# Patient Record
Sex: Male | Born: 1965 | Race: White | Hispanic: No | Marital: Married | Smoking: Never smoker
Health system: Southern US, Community
[De-identification: ages and names within clinical notes are randomized; demographics above are authoritative.]

## PROBLEM LIST (undated history)

## (undated) DIAGNOSIS — T4145XA Adverse effect of unspecified anesthetic, initial encounter: Secondary | ICD-10-CM

## (undated) DIAGNOSIS — K219 Gastro-esophageal reflux disease without esophagitis: Secondary | ICD-10-CM

## (undated) DIAGNOSIS — R112 Nausea with vomiting, unspecified: Secondary | ICD-10-CM

## (undated) DIAGNOSIS — M109 Gout, unspecified: Secondary | ICD-10-CM

## (undated) DIAGNOSIS — Z9889 Other specified postprocedural states: Secondary | ICD-10-CM

## (undated) DIAGNOSIS — E119 Type 2 diabetes mellitus without complications: Secondary | ICD-10-CM

## (undated) DIAGNOSIS — M479 Spondylosis, unspecified: Secondary | ICD-10-CM

## (undated) DIAGNOSIS — E7800 Pure hypercholesterolemia, unspecified: Secondary | ICD-10-CM

## (undated) DIAGNOSIS — I10 Essential (primary) hypertension: Secondary | ICD-10-CM

## (undated) DIAGNOSIS — T8859XA Other complications of anesthesia, initial encounter: Secondary | ICD-10-CM

## (undated) HISTORY — PX: HERNIA REPAIR: SHX51

## (undated) HISTORY — PX: BACK SURGERY: SHX140

---

## 2001-12-07 ENCOUNTER — Emergency Department (HOSPITAL_COMMUNITY): Admission: EM | Admit: 2001-12-07 | Discharge: 2001-12-07 | Payer: Self-pay | Admitting: *Deleted

## 2001-12-07 ENCOUNTER — Encounter: Payer: Self-pay | Admitting: *Deleted

## 2004-06-07 ENCOUNTER — Emergency Department (HOSPITAL_COMMUNITY): Admission: EM | Admit: 2004-06-07 | Discharge: 2004-06-07 | Payer: Self-pay | Admitting: Family Medicine

## 2014-03-15 ENCOUNTER — Emergency Department (HOSPITAL_COMMUNITY): Payer: Managed Care, Other (non HMO)

## 2014-03-15 ENCOUNTER — Emergency Department (HOSPITAL_COMMUNITY)
Admission: EM | Admit: 2014-03-15 | Discharge: 2014-03-15 | Disposition: A | Discharge status: Home / Self Care | Payer: Managed Care, Other (non HMO) | Attending: Emergency Medicine | Admitting: Emergency Medicine

## 2014-03-15 ENCOUNTER — Encounter (HOSPITAL_COMMUNITY): Payer: Self-pay | Admitting: Emergency Medicine

## 2014-03-15 DIAGNOSIS — M1990 Unspecified osteoarthritis, unspecified site: Secondary | ICD-10-CM | POA: Insufficient documentation

## 2014-03-15 DIAGNOSIS — M545 Low back pain, unspecified: Secondary | ICD-10-CM

## 2014-03-15 DIAGNOSIS — Z9889 Other specified postprocedural states: Secondary | ICD-10-CM | POA: Insufficient documentation

## 2014-03-15 DIAGNOSIS — G8929 Other chronic pain: Secondary | ICD-10-CM | POA: Insufficient documentation

## 2014-03-15 MED ORDER — DEXAMETHASONE SODIUM PHOSPHATE 10 MG/ML IJ SOLN
10.0000 mg | Freq: Once | INTRAMUSCULAR | Status: AC
  Administered 2014-03-15: 10 mg via INTRAVENOUS
  Filled 2014-03-15: qty 1

## 2014-03-15 MED ORDER — DIAZEPAM 5 MG PO TABS: 5.0000 mg | ORAL_TABLET | Freq: Two times a day (BID) | ORAL | Status: DC

## 2014-03-15 MED ORDER — PREDNISONE 10 MG PO TABS: 20.0000 mg | ORAL_TABLET | Freq: Every day | ORAL | Status: DC

## 2014-03-15 MED ORDER — OXYCODONE-ACETAMINOPHEN 5-325 MG PO TABS
2.0000 | ORAL_TABLET | Freq: Once | ORAL | Status: AC
  Administered 2014-03-15: 2 via ORAL
  Filled 2014-03-15: qty 2

## 2014-03-15 MED ORDER — KETOROLAC TROMETHAMINE 30 MG/ML IJ SOLN
30.0000 mg | Freq: Once | INTRAMUSCULAR | Status: AC
  Administered 2014-03-15: 30 mg via INTRAVENOUS
  Filled 2014-03-15: qty 1

## 2014-03-15 MED ORDER — OXYCODONE-ACETAMINOPHEN 5-325 MG PO TABS: 1.0000 | ORAL_TABLET | Freq: Four times a day (QID) | ORAL | Status: DC | PRN

## 2014-03-15 MED ORDER — HYDROMORPHONE HCL PF 1 MG/ML IJ SOLN
1.0000 mg | Freq: Once | INTRAMUSCULAR | Status: AC
  Administered 2014-03-15: 1 mg via INTRAVENOUS
  Filled 2014-03-15: qty 1

## 2014-03-15 MED ORDER — ONDANSETRON HCL 4 MG/2ML IJ SOLN
4.0000 mg | Freq: Once | INTRAMUSCULAR | Status: AC
  Administered 2014-03-15: 4 mg via INTRAVENOUS
  Filled 2014-03-15: qty 2

## 2014-03-15 NOTE — ED Provider Notes (Signed)
CSN: [id number]     Arrival date & time 03/15/14  1146 History  This chart was scribed for non-physician practitioner Dorthula Matasiffany G Jyl Chico, PA-C working with Glynn OctaveStephen Rancour, MD by Leone PayorSonum Patel, ED Scribe. This patient was seen in room TR06C/TR06C and the patient's care was started at 1:16 PM.     Chief Complaint  Patient presents with  . Back Pain      The history is provided by the patient. No language interpreter was used.    HPI Comments: Maurice Barker is a 48 y.o. male who presents to the Emergency Department complaining of chronic, constant left lower back pain that radiates down the left leg which worsened 3 days ago. He rates his pain 5/10 currently but states it increases when bearing weight on the left leg. Pt states he was at work which required him to twist his back and lean over all day for 3 days in a row wo lifting heavy weight. He has a history of laminectomy in the L3, L4 region. Pt states he cannot walk very well due to the pain. He denies numbness.  Denies weakness in legs or bowel/urine incontinence.    History reviewed. No pertinent past medical history. Past Surgical History  Procedure Laterality Date  . Back surgery     History reviewed. No pertinent family history. History  Substance Use Topics  . Smoking status: Never Smoker   . Smokeless tobacco: Not on file  . Alcohol Use: Yes    Review of Systems  Musculoskeletal: Positive for back pain. Negative for neck pain.  All other systems reviewed and are negative.      Allergies  Review of patient's allergies indicates no known allergies.  Home Medications   Current Outpatient Rx  Name  Route  Sig  Dispense  Refill  . diazepam (VALIUM) 5 MG tablet   Oral   Take 1 tablet (5 mg total) by mouth 2 (two) times daily.   10 tablet   0   . oxyCODONE-acetaminophen (PERCOCET/ROXICET) 5-325 MG per tablet   Oral   Take 1-2 tablets by mouth every 6 (six) hours as needed for severe pain.   20 tablet   0   .  predniSONE (DELTASONE) 10 MG tablet   Oral   Take 2 tablets (20 mg total) by mouth daily.   21 tablet   0     Prednisone dose pack directions:   6 tabs on day ...    BP 189/113  Pulse 108  Temp(Src) 97.3 F (36.3 C) (Oral)  Resp 20  Ht 6\' 2"  (1.88 m)  Wt 267 lb (121.11 kg)  BMI 34.27 kg/m2  SpO2 98% Physical Exam  Nursing note and vitals reviewed. Constitutional: He is oriented to person, place, and time. He appears well-developed and well-nourished.  HENT:  Head: Normocephalic and atraumatic.  Cardiovascular: Normal rate.   Pulmonary/Chest: Effort normal.  Abdominal: He exhibits no distension.  Musculoskeletal: He exhibits tenderness.  Left sided lumbar paraspinal tenderness that radiates down towards the left knee. Normal pedal pulse. Unable to access strength due to pain. NVI.   Neurological: He is alert and oriented to person, place, and time.  Skin: Skin is warm and dry.  Psychiatric: He has a normal mood and affect.    ED Course  Procedures (including critical care time)  DIAGNOSTIC STUDIES: Oxygen Saturation is 98% on RA, normal by my interpretation.    COORDINATION OF CARE: 1:19 PM Discussed negative imaging results. IV started to control  pain since patient is unable to ambulate. He has been give 1 mg Dilaudid, 10 mg IV decadron, 30 mg IV Toradol. Lumbar XRAY shows no disc herniation or acute changes. Patient has no focal neuro deficits requiring further imaging at this time. Will attempt to control pain and then reevaluate.    Labs Review Labs Reviewed - No data to display Imaging Review Dg Lumbar Spine Complete  03/15/2014   CLINICAL DATA:  Low back pain with left-sided radicular symptoms  EXAM: LUMBAR SPINE - COMPLETE 4+ VIEW  COMPARISON:  None.  FINDINGS: Frontal, lateral, spot lumbosacral lateral, and bilateral oblique views were obtained. There are 5 non-rib-bearing lumbar type vertebral bodies. There is anterior wedging of the T12 and L1 vertebral bodies  which does not appear acute. No other fractures. No spondylolisthesis. Is mild disc space narrowing at L1-2 and L4-5. There is facet osteoarthritic change at L5-S1 bilaterally.  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 Areas of osteoarthritic change. No spondylolisthesis.   Electronically Signed   By: Bretta Bang M.D.   On: 03/15/2014 12:56     EKG Interpretation None      MDM   Final diagnoses:  Low back pain  Osteoarthritis    Patients pain improved greatly with medications Has an appointment for the [facility]  48 y.o.Maurice Barker's  with back pain. No neurological deficits and normal neuro exam. Patient can walk but states is painful. No loss of bowel or bladder control. No concern for cauda equina. No fever, night sweats, weight loss, h/o cancer, IVDU. RICE protocol and pain medicine indicated and discussed with patient.   Patient Plan 1. Medications: narcotic pain medication, muscle relaxer and usual home medications  2. Treatment: rest, drink plenty of fluids, gentle stretching as discussed, alternate ice and heat  3. Follow Up: Please followup with your primary doctor for discussion of your diagnoses and further evaluation after today's visit; if you do not have a primary care doctor use the resource guide provided to find one   Vital signs are stable at discharge. Filed Vitals:   03/15/14 1207  BP: 189/113  Pulse: 108  Temp: 97.3 F (36.3 C)  Resp: 20    Patient/guardian has voiced understanding and agreed to follow-up with the PCP or specialist.     I personally performed the services described in this documentation, which was scribed in my presence. The recorded information has been reviewed and is accurate.      Dorthula Matas, PA-C 03/15/14 1447

## 2014-03-15 NOTE — ED Provider Notes (Signed)
Medical screening examination/treatment/procedure(s) were performed by non-physician practitioner and as supervising physician I was immediately available for consultation/collaboration.   EKG Interpretation None        Glynn OctaveStephen Lorriane Dehart, MD 03/15/14 [phone]

## 2014-03-15 NOTE — Discharge Instructions (Signed)
Back Pain, Adult Low back pain is very common. About 1 in 5 people have back pain.The cause of low back pain is rarely dangerous. The pain often gets better over time.About half of people with a sudden onset of back pain feel better in just 2 weeks. About 8 in 10 people feel better by 6 weeks.  CAUSES Some common causes of back pain include:  Strain of the muscles or ligaments supporting the spine.  Wear and tear (degeneration) of the spinal discs.  Arthritis.  Direct injury to the back. DIAGNOSIS Most of the time, the direct cause of low back pain is not known.However, back pain can be treated effectively even when the exact cause of the pain is unknown.Answering your caregiver's questions about your overall health and symptoms is one of the most accurate ways to make sure the cause of your pain is not dangerous. If your caregiver needs more information, he or she may order lab work or imaging tests (X-rays or MRIs).However, even if imaging tests show changes in your back, this usually does not require surgery. HOME CARE INSTRUCTIONS For many people, back pain returns.Since low back pain is rarely dangerous, it is often a condition that people can learn to manageon their own.   Remain active. It is stressful on the back to sit or stand in one place. Do not sit, drive, or stand in one place for more than 30 minutes at a time. Take short walks on level surfaces as soon as pain allows.Try to increase the length of time you walk each day.  Do not stay in bed.Resting more than 1 or 2 days can delay your recovery.  Do not avoid exercise or work.Your body is made to move.It is not dangerous to be active, even though your back may hurt.Your back will likely heal faster if you return to being active before your pain is gone.  Pay attention to your body when you bend and lift. Many people have less discomfortwhen lifting if they bend their knees, keep the load close to their bodies,and  avoid twisting. Often, the most comfortable positions are those that put less stress on your recovering back.  Find a comfortable position to sleep. Use a firm mattress and lie on your side with your knees slightly bent. If you lie on your back, put a pillow under your knees.  Only take over-the-counter or prescription medicines as directed by your caregiver. Over-the-counter medicines to reduce pain and inflammation are often the most helpful.Your caregiver may prescribe muscle relaxant drugs.These medicines help dull your pain so you can more quickly return to your normal activities and healthy exercise.  Put ice on the injured area.  Put ice in a plastic bag.  Place a towel between your skin and the bag.  Leave the ice on for 15-20 minutes, 03-04 times a day for the first 2 to 3 days. After that, ice and heat may be alternated to reduce pain and spasms.  Ask your caregiver about trying back exercises and gentle massage. This may be of some benefit.  Avoid feeling anxious or stressed.Stress increases muscle tension and can worsen back pain.It is important to recognize when you are anxious or stressed and learn ways to manage it.Exercise is a great option. SEEK MEDICAL CARE IF:  You have pain that is not relieved with rest or medicine.  You have pain that does not improve in 1 week.  You have new symptoms.  You are generally not feeling well. SEEK   IMMEDIATE MEDICAL CARE IF:   You have pain that radiates from your back into your legs.  You develop new bowel or bladder control problems.  You have unusual weakness or numbness in your arms or legs.  You develop nausea or vomiting.  You develop abdominal pain.  You feel faint. Document Released: 12/13/2005 Document Revised: 06/13/2012 Document Reviewed: 05/03/2011 [employer] Patient Information 2014 [employer].  

## 2014-03-15 NOTE — ED Notes (Signed)
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

## 2014-03-20 ENCOUNTER — Encounter (HOSPITAL_COMMUNITY): Payer: Self-pay | Admitting: *Deleted

## 2014-03-20 ENCOUNTER — Other Ambulatory Visit: Payer: Self-pay | Admitting: Neurosurgery

## 2014-03-20 NOTE — Progress Notes (Signed)
According to pt, he was informed by Arlys JohnBrian, RN at surgeons office that he can eat up until 8:00AM on the DOS and was also instructed by the MD that he did not have to take the last dose of Prednisone if he didn't want to. Pt stated that he had a BMP lab draw done today 03/20/14 and denies having an EKG, chest x ray within the last 12 months and any cardiac studies.

## 2014-03-21 ENCOUNTER — Ambulatory Visit (HOSPITAL_COMMUNITY): Payer: Managed Care, Other (non HMO) | Admitting: Certified Registered Nurse Anesthetist

## 2014-03-21 ENCOUNTER — Ambulatory Visit (HOSPITAL_COMMUNITY): Payer: Managed Care, Other (non HMO)

## 2014-03-21 ENCOUNTER — Encounter (HOSPITAL_COMMUNITY)
Admission: RE | Disposition: A | Discharge status: Home / Self Care | Payer: Self-pay | Source: Ambulatory Visit | Attending: Neurosurgery

## 2014-03-21 ENCOUNTER — Encounter (HOSPITAL_COMMUNITY): Payer: Self-pay | Admitting: *Deleted

## 2014-03-21 ENCOUNTER — Encounter (HOSPITAL_COMMUNITY): Payer: Managed Care, Other (non HMO) | Admitting: Certified Registered Nurse Anesthetist

## 2014-03-21 ENCOUNTER — Observation Stay (HOSPITAL_COMMUNITY)
Admission: RE | Admit: 2014-03-21 | Discharge: 2014-03-22 | Disposition: A | Discharge status: Home / Self Care | Payer: Managed Care, Other (non HMO) | Source: Ambulatory Visit | Attending: Neurosurgery | Admitting: Neurosurgery

## 2014-03-21 DIAGNOSIS — M5126 Other intervertebral disc displacement, lumbar region: Principal | ICD-10-CM | POA: Insufficient documentation

## 2014-03-21 DIAGNOSIS — I10 Essential (primary) hypertension: Secondary | ICD-10-CM | POA: Insufficient documentation

## 2014-03-21 DIAGNOSIS — I509 Heart failure, unspecified: Secondary | ICD-10-CM | POA: Insufficient documentation

## 2014-03-21 DIAGNOSIS — Z7982 Long term (current) use of aspirin: Secondary | ICD-10-CM | POA: Insufficient documentation

## 2014-03-21 DIAGNOSIS — G4730 Sleep apnea, unspecified: Secondary | ICD-10-CM | POA: Insufficient documentation

## 2014-03-21 DIAGNOSIS — M47817 Spondylosis without myelopathy or radiculopathy, lumbosacral region: Secondary | ICD-10-CM | POA: Insufficient documentation

## 2014-03-21 DIAGNOSIS — M51379 Other intervertebral disc degeneration, lumbosacral region without mention of lumbar back pain or lower extremity pain: Secondary | ICD-10-CM | POA: Insufficient documentation

## 2014-03-21 DIAGNOSIS — M5137 Other intervertebral disc degeneration, lumbosacral region: Secondary | ICD-10-CM | POA: Insufficient documentation

## 2014-03-21 DIAGNOSIS — I252 Old myocardial infarction: Secondary | ICD-10-CM | POA: Insufficient documentation

## 2014-03-21 DIAGNOSIS — E119 Type 2 diabetes mellitus without complications: Secondary | ICD-10-CM | POA: Insufficient documentation

## 2014-03-21 HISTORY — DX: Pure hypercholesterolemia, unspecified: E78.00

## 2014-03-21 HISTORY — DX: Gastro-esophageal reflux disease without esophagitis: K21.9

## 2014-03-21 HISTORY — DX: Other complications of anesthesia, initial encounter: T88.59XA

## 2014-03-21 HISTORY — DX: Other specified postprocedural states: Z98.890

## 2014-03-21 HISTORY — PX: LUMBAR LAMINECTOMY/DECOMPRESSION MICRODISCECTOMY: SHX5026

## 2014-03-21 HISTORY — DX: Gout, unspecified: M10.9

## 2014-03-21 HISTORY — DX: Type 2 diabetes mellitus without complications: E11.9

## 2014-03-21 HISTORY — DX: Essential (primary) hypertension: I10

## 2014-03-21 HISTORY — DX: Nausea with vomiting, unspecified: R11.2

## 2014-03-21 HISTORY — DX: Spondylosis, unspecified: M47.9

## 2014-03-21 HISTORY — DX: Adverse effect of unspecified anesthetic, initial encounter: T41.45XA

## 2014-03-21 LAB — COMPREHENSIVE METABOLIC PANEL
ALT: 37 U/L (ref 0–53)
AST: 23 U/L (ref 0–37)
Albumin: 3.8 g/dL (ref 3.5–5.2)
Alkaline Phosphatase: 61 U/L (ref 39–117)
BUN: 19 mg/dL (ref 6–23)
CHLORIDE: 94 meq/L — AB (ref 96–112)
CO2: 28 meq/L (ref 19–32)
Calcium: 9.5 mg/dL (ref 8.4–10.5)
Creatinine, Ser: 0.86 mg/dL (ref 0.50–1.35)
GFR calc Af Amer: >90 mL/min (ref >90)
GFR calc non Af Amer: >90 mL/min (ref >90)
Glucose, Bld: 216 mg/dL — ABNORMAL HIGH (ref 70–99)
Potassium: 4.7 mEq/L (ref 3.7–5.3)
SODIUM: 137 meq/L (ref 137–147)
Total Bilirubin: 0.6 mg/dL (ref 0.3–1.2)
Total Protein: 7.3 g/dL (ref 6.0–8.3)

## 2014-03-21 LAB — CBC
HCT: 47.7 % (ref 39.0–52.0)
Hemoglobin: 16.6 g/dL (ref 13.0–17.0)
MCH: 28.4 pg (ref 26.0–34.0)
MCHC: 34.8 g/dL (ref 30.0–36.0)
MCV: 81.5 fL (ref 78.0–100.0)
PLATELETS: 309 10*3/uL (ref 150–400)
RBC: 5.85 MIL/uL — ABNORMAL HIGH (ref 4.22–5.81)
RDW: 13.2 % (ref 11.5–15.5)
WBC: 11.2 10*3/uL — ABNORMAL HIGH (ref 4.0–10.5)

## 2014-03-21 LAB — GLUCOSE, CAPILLARY
GLUCOSE-CAPILLARY: 153 mg/dL — AB (ref 70–99)
Glucose-Capillary: 217 mg/dL — ABNORMAL HIGH (ref 70–99)
Glucose-Capillary: 188 mg/dL — ABNORMAL HIGH (ref 70–99)

## 2014-03-21 LAB — SURGICAL PCR SCREEN
MRSA, PCR: NEGATIVE
Staphylococcus aureus: NEGATIVE

## 2014-03-21 SURGERY — LUMBAR LAMINECTOMY/DECOMPRESSION MICRODISCECTOMY 1 LEVEL
Anesthesia: General | Site: Back | Laterality: Left

## 2014-03-21 MED ORDER — ROCURONIUM BROMIDE 50 MG/5ML IV SOLN
INTRAVENOUS | Status: AC
  Filled 2014-03-21: qty 1

## 2014-03-21 MED ORDER — DIAZEPAM 5 MG PO TABS: 5.0000 mg | ORAL_TABLET | Freq: Two times a day (BID) | ORAL | Status: DC

## 2014-03-21 MED ORDER — OXYCODONE-ACETAMINOPHEN 5-325 MG PO TABS: 1.0000 | ORAL_TABLET | Freq: Four times a day (QID) | ORAL | Status: DC | PRN

## 2014-03-21 MED ORDER — DOCUSATE SODIUM 100 MG PO CAPS
100.0000 mg | ORAL_CAPSULE | Freq: Two times a day (BID) | ORAL | Status: DC
  Administered 2014-03-21: 100 mg via ORAL
  Filled 2014-03-21: qty 1

## 2014-03-21 MED ORDER — SODIUM CHLORIDE 0.9 % IJ SOLN: 3.0000 mL | INTRAMUSCULAR | Status: DC | PRN

## 2014-03-21 MED ORDER — FENTANYL CITRATE 0.05 MG/ML IJ SOLN
INTRAMUSCULAR | Status: DC | PRN
  Administered 2014-03-21: 100 ug via INTRAVENOUS

## 2014-03-21 MED ORDER — FENTANYL CITRATE 0.05 MG/ML IJ SOLN
INTRAMUSCULAR | Status: AC
  Filled 2014-03-21: qty 2

## 2014-03-21 MED ORDER — LINAGLIPTIN 5 MG PO TABS
5.0000 mg | ORAL_TABLET | Freq: Every day | ORAL | Status: DC
  Filled 2014-03-21: qty 1

## 2014-03-21 MED ORDER — MUPIROCIN 2 % EX OINT
TOPICAL_OINTMENT | Freq: Two times a day (BID) | CUTANEOUS | Status: DC
  Administered 2014-03-21: 1 via NASAL
  Filled 2014-03-21 (×2): qty 22

## 2014-03-21 MED ORDER — KETAMINE HCL 100 MG/ML IJ SOLN
INTRAMUSCULAR | Status: AC
  Filled 2014-03-21: qty 1

## 2014-03-21 MED ORDER — ONDANSETRON HCL 4 MG/2ML IJ SOLN
INTRAMUSCULAR | Status: AC
  Filled 2014-03-21: qty 2

## 2014-03-21 MED ORDER — METFORMIN HCL 500 MG PO TABS
500.0000 mg | ORAL_TABLET | Freq: Two times a day (BID) | ORAL | Status: DC
  Administered 2014-03-22: 500 mg via ORAL
  Filled 2014-03-21 (×3): qty 1

## 2014-03-21 MED ORDER — OXYCODONE-ACETAMINOPHEN 5-325 MG PO TABS: 1.0000 | ORAL_TABLET | ORAL | Status: DC | PRN

## 2014-03-21 MED ORDER — ROCURONIUM BROMIDE 100 MG/10ML IV SOLN
INTRAVENOUS | Status: DC | PRN
  Administered 2014-03-21: 30 mg via INTRAVENOUS
  Administered 2014-03-21: 50 mg via INTRAVENOUS

## 2014-03-21 MED ORDER — GLYCOPYRROLATE 0.2 MG/ML IJ SOLN
INTRAMUSCULAR | Status: AC
  Filled 2014-03-21: qty 3

## 2014-03-21 MED ORDER — ATORVASTATIN CALCIUM 10 MG PO TABS
10.0000 mg | ORAL_TABLET | Freq: Every day | ORAL | Status: DC
  Filled 2014-03-21: qty 1

## 2014-03-21 MED ORDER — DIAZEPAM 5 MG PO TABS
5.0000 mg | ORAL_TABLET | Freq: Four times a day (QID) | ORAL | Status: DC | PRN
  Administered 2014-03-21: 5 mg via ORAL

## 2014-03-21 MED ORDER — LIDOCAINE HCL (CARDIAC) 20 MG/ML IV SOLN
INTRAVENOUS | Status: DC | PRN
  Administered 2014-03-21: 80 mg via INTRAVENOUS

## 2014-03-21 MED ORDER — NEOSTIGMINE METHYLSULFATE 1 MG/ML IJ SOLN
INTRAMUSCULAR | Status: DC | PRN
  Administered 2014-03-21: 4 mg via INTRAVENOUS

## 2014-03-21 MED ORDER — KETAMINE HCL 10 MG/ML IJ SOLN
INTRAMUSCULAR | Status: DC | PRN
  Administered 2014-03-21: 40 mg via INTRAVENOUS

## 2014-03-21 MED ORDER — FLEET ENEMA 7-19 GM/118ML RE ENEM: 1.0000 | ENEMA | Freq: Once | RECTAL | Status: AC | PRN

## 2014-03-21 MED ORDER — HEMOSTATIC AGENTS (NO CHARGE) OPTIME
TOPICAL | Status: DC | PRN
  Administered 2014-03-21: 1 via TOPICAL

## 2014-03-21 MED ORDER — ONDANSETRON HCL 4 MG/2ML IJ SOLN: 4.0000 mg | Freq: Once | INTRAMUSCULAR | Status: DC | PRN

## 2014-03-21 MED ORDER — PANTOPRAZOLE SODIUM 40 MG IV SOLR
40.0000 mg | Freq: Every day | INTRAVENOUS | Status: DC
  Administered 2014-03-21: 40 mg via INTRAVENOUS
  Filled 2014-03-21 (×2): qty 40

## 2014-03-21 MED ORDER — NEOSTIGMINE METHYLSULFATE 1 MG/ML IJ SOLN
INTRAMUSCULAR | Status: AC
  Filled 2014-03-21: qty 10

## 2014-03-21 MED ORDER — OXYCODONE HCL 5 MG/5ML PO SOLN: 5.0000 mg | Freq: Once | ORAL | Status: AC | PRN

## 2014-03-21 MED ORDER — KCL IN DEXTROSE-NACL 20-5-0.45 MEQ/L-%-% IV SOLN
INTRAVENOUS | Status: AC
  Filled 2014-03-21: qty 1000

## 2014-03-21 MED ORDER — MIDAZOLAM HCL 5 MG/5ML IJ SOLN
INTRAMUSCULAR | Status: DC | PRN
  Administered 2014-03-21: 2 mg via INTRAVENOUS

## 2014-03-21 MED ORDER — OXYCODONE HCL 5 MG PO TABS
ORAL_TABLET | ORAL | Status: AC
  Filled 2014-03-21: qty 1

## 2014-03-21 MED ORDER — HYDROMORPHONE HCL PF 1 MG/ML IJ SOLN
INTRAMUSCULAR | Status: AC
  Filled 2014-03-21: qty 1

## 2014-03-21 MED ORDER — HYDROCODONE-ACETAMINOPHEN 5-325 MG PO TABS: 1.0000 | ORAL_TABLET | ORAL | Status: DC | PRN

## 2014-03-21 MED ORDER — FENTANYL CITRATE 0.05 MG/ML IJ SOLN
INTRAMUSCULAR | Status: AC
  Filled 2014-03-21: qty 5

## 2014-03-21 MED ORDER — BUPIVACAINE HCL (PF) 0.5 % IJ SOLN
INTRAMUSCULAR | Status: DC | PRN
  Administered 2014-03-21: 10 mL

## 2014-03-21 MED ORDER — INSULIN ASPART 100 UNIT/ML ~~LOC~~ SOLN: 0.0000 [IU] | Freq: Every day | SUBCUTANEOUS | Status: DC

## 2014-03-21 MED ORDER — MENTHOL 3 MG MT LOZG: 1.0000 | LOZENGE | OROMUCOSAL | Status: DC | PRN

## 2014-03-21 MED ORDER — CEFAZOLIN SODIUM 1-5 GM-% IV SOLN
INTRAVENOUS | Status: AC
Start: 2014-03-21 — End: 2014-03-22
  Filled 2014-03-21: qty 50

## 2014-03-21 MED ORDER — DIAZEPAM 5 MG PO TABS
ORAL_TABLET | ORAL | Status: AC
  Filled 2014-03-21: qty 1

## 2014-03-21 MED ORDER — ASPIRIN 81 MG PO CHEW
81.0000 mg | CHEWABLE_TABLET | Freq: Every day | ORAL | Status: DC
  Filled 2014-03-21: qty 1

## 2014-03-21 MED ORDER — SODIUM CHLORIDE 0.9 % IV SOLN
250.0000 mL | INTRAVENOUS | Status: DC
  Administered 2014-03-21: 250 mL via INTRAVENOUS

## 2014-03-21 MED ORDER — CEFAZOLIN SODIUM-DEXTROSE 2-3 GM-% IV SOLR
2.0000 g | Freq: Three times a day (TID) | INTRAVENOUS | Status: AC
  Administered 2014-03-21 – 2014-03-22 (×2): 2 g via INTRAVENOUS
  Filled 2014-03-21 (×2): qty 50

## 2014-03-21 MED ORDER — THROMBIN 5000 UNITS EX SOLR
CUTANEOUS | Status: DC | PRN
  Administered 2014-03-21 (×2): 5000 [IU] via TOPICAL

## 2014-03-21 MED ORDER — SODIUM CHLORIDE 0.9 % IV SOLN
10.0000 mg | INTRAVENOUS | Status: DC | PRN
  Administered 2014-03-21: 20 ug/min via INTRAVENOUS

## 2014-03-21 MED ORDER — INSULIN ASPART 100 UNIT/ML ~~LOC~~ SOLN
3.0000 [IU] | Freq: Three times a day (TID) | SUBCUTANEOUS | Status: DC
  Administered 2014-03-22: 3 [IU] via SUBCUTANEOUS

## 2014-03-21 MED ORDER — SODIUM CHLORIDE 0.9 % IJ SOLN
3.0000 mL | Freq: Two times a day (BID) | INTRAMUSCULAR | Status: DC
  Administered 2014-03-21: 3 mL via INTRAVENOUS

## 2014-03-21 MED ORDER — LIDOCAINE HCL (CARDIAC) 20 MG/ML IV SOLN
INTRAVENOUS | Status: AC
  Filled 2014-03-21: qty 5

## 2014-03-21 MED ORDER — HYDROMORPHONE HCL PF 1 MG/ML IJ SOLN
INTRAMUSCULAR | Status: AC
  Administered 2014-03-21: .2 mg via INTRAVENOUS
  Administered 2014-03-21: .4 mg via INTRAVENOUS
  Administered 2014-03-21 (×2): .2 mg via INTRAVENOUS
  Filled 2014-03-21: qty 1

## 2014-03-21 MED ORDER — SENNA 8.6 MG PO TABS
1.0000 | ORAL_TABLET | Freq: Two times a day (BID) | ORAL | Status: DC
  Administered 2014-03-21: 8.6 mg via ORAL
  Filled 2014-03-21 (×3): qty 1

## 2014-03-21 MED ORDER — CEFAZOLIN SODIUM-DEXTROSE 2-3 GM-% IV SOLR
INTRAVENOUS | Status: AC
  Filled 2014-03-21: qty 50

## 2014-03-21 MED ORDER — MORPHINE SULFATE 2 MG/ML IJ SOLN: 1.0000 mg | INTRAMUSCULAR | Status: DC | PRN

## 2014-03-21 MED ORDER — KCL IN DEXTROSE-NACL 20-5-0.45 MEQ/L-%-% IV SOLN
INTRAVENOUS | Status: DC
  Administered 2014-03-21: 20:00:00 via INTRAVENOUS
  Filled 2014-03-21 (×3): qty 1000

## 2014-03-21 MED ORDER — METHYLPREDNISOLONE ACETATE 80 MG/ML IJ SUSP
INTRAMUSCULAR | Status: DC | PRN
  Administered 2014-03-21: 80 mg

## 2014-03-21 MED ORDER — PREDNISONE 10 MG PO TABS
10.0000 mg | ORAL_TABLET | Freq: Every day | ORAL | Status: DC
  Filled 2014-03-21: qty 6

## 2014-03-21 MED ORDER — PHENOL 1.4 % MT LIQD: 1.0000 | OROMUCOSAL | Status: DC | PRN

## 2014-03-21 MED ORDER — DEXTROSE 5 % IV SOLN: 3.0000 g | Freq: Three times a day (TID) | INTRAVENOUS | Status: DC

## 2014-03-21 MED ORDER — DEXTROSE 5 % IV SOLN
3.0000 g | INTRAVENOUS | Status: AC
  Administered 2014-03-21: 3 g via INTRAVENOUS
  Filled 2014-03-21: qty 3000

## 2014-03-21 MED ORDER — PROPOFOL 10 MG/ML IV BOLUS
INTRAVENOUS | Status: DC | PRN
  Administered 2014-03-21: 200 mg via INTRAVENOUS

## 2014-03-21 MED ORDER — POLYETHYLENE GLYCOL 3350 17 G PO PACK
17.0000 g | PACK | Freq: Every day | ORAL | Status: DC | PRN
Start: 2014-03-21 — End: 2014-03-22
  Filled 2014-03-21: qty 1

## 2014-03-21 MED ORDER — HYDROMORPHONE HCL PF 1 MG/ML IJ SOLN
0.2500 mg | INTRAMUSCULAR | Status: DC | PRN
  Administered 2014-03-21 (×4): 0.5 mg via INTRAVENOUS

## 2014-03-21 MED ORDER — PHENYLEPHRINE HCL 10 MG/ML IJ SOLN
INTRAMUSCULAR | Status: DC | PRN
  Administered 2014-03-21 (×4): 80 ug via INTRAVENOUS

## 2014-03-21 MED ORDER — LIDOCAINE-EPINEPHRINE 1 %-1:100000 IJ SOLN
INTRAMUSCULAR | Status: DC | PRN
  Administered 2014-03-21: 10 mL

## 2014-03-21 MED ORDER — INSULIN ASPART 100 UNIT/ML ~~LOC~~ SOLN
0.0000 [IU] | Freq: Three times a day (TID) | SUBCUTANEOUS | Status: DC
  Administered 2014-03-22: 3 [IU] via SUBCUTANEOUS

## 2014-03-21 MED ORDER — LACTATED RINGERS IV SOLN
INTRAVENOUS | Status: DC
  Administered 2014-03-21: 15:00:00 via INTRAVENOUS

## 2014-03-21 MED ORDER — GLYCOPYRROLATE 0.2 MG/ML IJ SOLN
INTRAMUSCULAR | Status: DC | PRN
  Administered 2014-03-21: 0.6 mg via INTRAVENOUS

## 2014-03-21 MED ORDER — LACTATED RINGERS IV SOLN
INTRAVENOUS | Status: DC | PRN
  Administered 2014-03-21 (×2): via INTRAVENOUS

## 2014-03-21 MED ORDER — ACETAMINOPHEN 160 MG/5ML PO SOLN
325.0000 mg | ORAL | Status: DC | PRN
  Filled 2014-03-21: qty 20.3

## 2014-03-21 MED ORDER — ACETAMINOPHEN 650 MG RE SUPP: 650.0000 mg | RECTAL | Status: DC | PRN

## 2014-03-21 MED ORDER — MIDAZOLAM HCL 2 MG/2ML IJ SOLN
INTRAMUSCULAR | Status: AC
  Filled 2014-03-21: qty 2

## 2014-03-21 MED ORDER — ONDANSETRON HCL 4 MG/2ML IJ SOLN
INTRAMUSCULAR | Status: DC | PRN
  Administered 2014-03-21: 4 mg via INTRAVENOUS

## 2014-03-21 MED ORDER — ALUM & MAG HYDROXIDE-SIMETH 200-200-20 MG/5ML PO SUSP
30.0000 mL | Freq: Four times a day (QID) | ORAL | Status: DC | PRN
Start: 2014-03-21 — End: 2014-03-22

## 2014-03-21 MED ORDER — HYDROCODONE-ACETAMINOPHEN 10-325 MG PO TABS: 1.0000 | ORAL_TABLET | Freq: Four times a day (QID) | ORAL | Status: DC | PRN

## 2014-03-21 MED ORDER — LOSARTAN POTASSIUM 50 MG PO TABS
50.0000 mg | ORAL_TABLET | Freq: Every day | ORAL | Status: DC
  Filled 2014-03-21: qty 1

## 2014-03-21 MED ORDER — 0.9 % SODIUM CHLORIDE (POUR BTL) OPTIME
TOPICAL | Status: DC | PRN
  Administered 2014-03-21: 1000 mL

## 2014-03-21 MED ORDER — CYCLOBENZAPRINE HCL 10 MG PO TABS
10.0000 mg | ORAL_TABLET | Freq: Once | ORAL | Status: AC
  Administered 2014-03-21: 10 mg via ORAL
  Filled 2014-03-21: qty 1

## 2014-03-21 MED ORDER — OXYCODONE HCL 5 MG PO TABS
5.0000 mg | ORAL_TABLET | Freq: Once | ORAL | Status: AC | PRN
  Administered 2014-03-21: 5 mg via ORAL

## 2014-03-21 MED ORDER — FENTANYL CITRATE 0.05 MG/ML IJ SOLN
INTRAMUSCULAR | Status: DC | PRN
  Administered 2014-03-21: 250 ug via INTRAVENOUS

## 2014-03-21 MED ORDER — ONDANSETRON HCL 4 MG/2ML IJ SOLN: 4.0000 mg | INTRAMUSCULAR | Status: DC | PRN

## 2014-03-21 MED ORDER — ACETAMINOPHEN 325 MG PO TABS: 325.0000 mg | ORAL_TABLET | ORAL | Status: DC | PRN

## 2014-03-21 MED ORDER — ACETAMINOPHEN 325 MG PO TABS: 650.0000 mg | ORAL_TABLET | ORAL | Status: DC | PRN

## 2014-03-21 MED ORDER — BISACODYL 10 MG RE SUPP: 10.0000 mg | Freq: Every day | RECTAL | Status: DC | PRN

## 2014-03-21 SURGICAL SUPPLY — 65 items
BAG DECANTER FOR FLEXI CONT (MISCELLANEOUS) ×3 IMPLANT
BENZOIN TINCTURE PRP APPL 2/3 (GAUZE/BANDAGES/DRESSINGS) IMPLANT
BIT DRILL NEURO 2X3.1 SFT TUCH (MISCELLANEOUS) ×1 IMPLANT
BLADE SURG ROTATE 9660 (MISCELLANEOUS) IMPLANT
BUR ROUND FLUTED 5 RND (BURR) ×2 IMPLANT
BUR ROUND FLUTED 5MM RND (BURR) ×1
CANISTER SUCT 3000ML (MISCELLANEOUS) ×3 IMPLANT
CLOSURE WOUND 1/2 X4 (GAUZE/BANDAGES/DRESSINGS)
CONT SPEC 4OZ CLIKSEAL STRL BL (MISCELLANEOUS) ×3 IMPLANT
DERMABOND ADVANCED (GAUZE/BANDAGES/DRESSINGS) ×2
DERMABOND ADVANCED .7 DNX12 (GAUZE/BANDAGES/DRESSINGS) ×1 IMPLANT
DRAPE LAPAROTOMY 100X72X124 (DRAPES) ×3 IMPLANT
DRAPE MICROSCOPE LEICA (MISCELLANEOUS) ×3 IMPLANT
DRAPE POUCH INSTRU U-SHP 10X18 (DRAPES) ×3 IMPLANT
DRAPE SURG 17X23 STRL (DRAPES) ×3 IMPLANT
DRESSING TELFA 8X3 (GAUZE/BANDAGES/DRESSINGS) IMPLANT
DRILL NEURO 2X3.1 SOFT TOUCH (MISCELLANEOUS) ×3
DURAPREP 26ML APPLICATOR (WOUND CARE) ×3 IMPLANT
ELECT REM PT RETURN 9FT ADLT (ELECTROSURGICAL) ×3
ELECTRODE REM PT RTRN 9FT ADLT (ELECTROSURGICAL) ×1 IMPLANT
GAUZE SPONGE 4X4 16PLY XRAY LF (GAUZE/BANDAGES/DRESSINGS) IMPLANT
GLOVE BIO SURGEON STRL SZ 6.5 (GLOVE) ×4 IMPLANT
GLOVE BIO SURGEON STRL SZ7 (GLOVE) ×3 IMPLANT
GLOVE BIO SURGEON STRL SZ8 (GLOVE) ×3 IMPLANT
GLOVE BIO SURGEONS STRL SZ 6.5 (GLOVE) ×2
GLOVE BIOGEL PI IND STRL 6.5 (GLOVE) ×1 IMPLANT
GLOVE BIOGEL PI IND STRL 7.0 (GLOVE) ×1 IMPLANT
GLOVE BIOGEL PI IND STRL 8 (GLOVE) ×2 IMPLANT
GLOVE BIOGEL PI IND STRL 8.5 (GLOVE) ×1 IMPLANT
GLOVE BIOGEL PI INDICATOR 6.5 (GLOVE) ×2
GLOVE BIOGEL PI INDICATOR 7.0 (GLOVE) ×2
GLOVE BIOGEL PI INDICATOR 8 (GLOVE) ×4
GLOVE BIOGEL PI INDICATOR 8.5 (GLOVE) ×2
GLOVE ECLIPSE 7.5 STRL STRAW (GLOVE) ×3 IMPLANT
GLOVE ECLIPSE 8.0 STRL XLNG CF (GLOVE) ×3 IMPLANT
GLOVE EXAM NITRILE LRG STRL (GLOVE) IMPLANT
GLOVE EXAM NITRILE MD LF STRL (GLOVE) ×3 IMPLANT
GLOVE EXAM NITRILE XL STR (GLOVE) IMPLANT
GLOVE EXAM NITRILE XS STR PU (GLOVE) IMPLANT
GOWN BRE IMP SLV AUR LG STRL (GOWN DISPOSABLE) IMPLANT
GOWN BRE IMP SLV AUR XL STRL (GOWN DISPOSABLE) IMPLANT
GOWN L4 LG 24 PK N/S (GOWN DISPOSABLE) ×12 IMPLANT
GOWN STRL REIN 2XL LVL4 (GOWN DISPOSABLE) IMPLANT
GOWN STRL REUS W/TWL 2XL LVL3 (GOWN DISPOSABLE) ×3 IMPLANT
KIT BASIN OR (CUSTOM PROCEDURE TRAY) ×3 IMPLANT
KIT ROOM TURNOVER OR (KITS) ×3 IMPLANT
NEEDLE HYPO 18GX1.5 BLUNT FILL (NEEDLE) ×3 IMPLANT
NEEDLE HYPO 25X1 1.5 SAFETY (NEEDLE) ×6 IMPLANT
NEEDLE SPNL 18GX3.5 QUINCKE PK (NEEDLE) ×3 IMPLANT
NS IRRIG 1000ML POUR BTL (IV SOLUTION) ×3 IMPLANT
PACK LAMINECTOMY NEURO (CUSTOM PROCEDURE TRAY) ×3 IMPLANT
PAD ARMBOARD 7.5X6 YLW CONV (MISCELLANEOUS) ×9 IMPLANT
RUBBERBAND STERILE (MISCELLANEOUS) ×6 IMPLANT
SPONGE GAUZE 4X4 12PLY (GAUZE/BANDAGES/DRESSINGS) IMPLANT
SPONGE SURGIFOAM ABS GEL SZ50 (HEMOSTASIS) ×3 IMPLANT
STRIP CLOSURE SKIN 1/2X4 (GAUZE/BANDAGES/DRESSINGS) IMPLANT
SUT VIC AB 0 CT1 18XCR BRD8 (SUTURE) ×1 IMPLANT
SUT VIC AB 0 CT1 8-18 (SUTURE) ×2
SUT VIC AB 2-0 CT1 18 (SUTURE) ×3 IMPLANT
SUT VIC AB 3-0 SH 8-18 (SUTURE) ×3 IMPLANT
SYR 20ML ECCENTRIC (SYRINGE) ×3 IMPLANT
SYR 5ML LL (SYRINGE) ×3 IMPLANT
TOWEL OR 17X24 6PK STRL BLUE (TOWEL DISPOSABLE) ×3 IMPLANT
TOWEL OR 17X26 10 PK STRL BLUE (TOWEL DISPOSABLE) ×3 IMPLANT
WATER STERILE IRR 1000ML POUR (IV SOLUTION) ×3 IMPLANT

## 2014-03-21 NOTE — H&P (Signed)
> [address] [place], [place] [address] Phone: [phone]   Patient ID:   [id number] Patient: Maurice Barker  Date of Birth: 19-Jan-1966 Visit Type: Office Visit   Date: 03/20/2014 02:00 PM Provider: Danae Orleans. Venetia Maxon MD   This 48 year old male presents for back pain.  History of Present Illness: 1.  back pain  Maurice Barker, 47y.o. male employed by [employer], visits reporting severe lumbar, left buttock, left groin, and LLE pain numbness and tingling, with weakness in his LLE.  His Prednisone dose pack ends tomorrow, but has only helped "a little".    Hx: NIDDM, HTN  Norco 10/325 QID Flexeril 10mg  TID Prednisone taper ending   MRI, X-ray uploaded to [facility]  Patient describes 10 out of 10 pain in his low back and left leg.  He says his left leg buckles on him.  He still describes as a constant dull throbbing pain he is not able to get any relief he is not able to sit he still on prednisone and it is complaining of severe persistent pain.  He is not able to sit on his left buttock.  He is able to get some relief laying down or laying on his right hip but as soon as he stands up he says that his pain is 10 out of 10.  He needs support when walks because his left leg buckles.        PAST MEDICAL/SURGICAL HISTORY   (Detailed)  Disease/disorder Onset Date Management Date Comments    Surgery, lumbar spine 1993   Diabetes type 2      Hypertension          PAST MEDICAL HISTORY, SURGICAL HISTORY, FAMILY HISTORY, SOCIAL HISTORY AND REVIEW OF SYSTEMS I have reviewed the patient's past medical, surgical, family and social history as well as the comprehensive review of systems as included on the [facility] history form dated 03/20/2014, which I have signed.  Family History  (Detailed)  Relationship Family Member Name Deceased Age at Death Condition Onset Age Cause of Death      Family history of Diabetes mellitus  N    Family history of Hypertension  N   SOCIAL HISTORY  (Detailed) Tobacco use reviewed. Preferred language is [place].   Smoking status: Never smoker.  SMOKING STATUS Use Status Type Smoking Status Usage Per Day Years Used Total Pack Years  no/never  Never smoker             MEDICATIONS(added, continued or stopped this visit):   Started Medication Directions Instruction Stopped   aspirin 81 mg tablet,delayed release take 1 tablet by oral route  every day     atorvastatin 10 mg tablet take 1 tablet by oral route  every day     cyclobenzaprine 10 mg tablet take 1 tablet by oral route  every day     losartan 50 mg tablet take 1 tablet by oral route  every day     metformin 500 mg tablet take 1 tablet by oral route 2 times every day with morning and evening meals     Norco 10 mg-325 mg tablet take 1 tablet by oral route  every 6 hours as needed for pain     Onglyza 5 mg tablet take 1 tablet by oral route  every day      ALLERGIES:  Ingredient Reaction Medication Name Comment  NO KNOWN ALLERGIES     No known allergies.  REVIEW OF  SYSTEMS System Neg/Pos Details  Constitutional Negative Chills, fatigue, fever, malaise, night sweats, weight gain and weight loss.  ENMT Negative Ear drainage, hearing loss, nasal drainage, otalgia, sinus pressure and sore throat.  Eyes Negative Eye discharge, eye pain and vision changes.  Respiratory Negative Chronic cough, cough, dyspnea, known TB exposure and wheezing.  Cardio Negative Chest pain, claudication, edema and irregular heartbeat/palpitations.  GI Negative Abdominal pain, blood in stool, change in stool pattern, constipation, decreased appetite, diarrhea, heartburn, nausea and vomiting.  GU Negative Dribbling, dysuria, erectile dysfunction, hematuria, polyuria, slow stream, urinary frequency, urinary incontinence and urinary retention.  Endocrine Negative Cold intolerance, heat intolerance, polydipsia and polyphagia.  Neuro Positive  Extremity weakness, Gait disturbance, Numbness in extremity.  Psych Negative Anxiety, depression and insomnia.  Integumentary Negative Brittle hair, brittle nails, change in shape/size of mole(s), hair loss, hirsutism, hives, pruritus, rash and skin lesion.  MS Positive Back pain, LLE pain.  Hema/Lymph Negative Easy bleeding, easy bruising and lymphadenopathy.  Allergic/Immuno Negative Contact allergy, environmental allergies, food allergies and seasonal allergies.  Reproductive Negative Penile discharge and sexual dysfunction.    Vitals Date Temp F BP Pulse Ht In Wt Lb BMI BSA Pain Score  03/20/2014  176/135 121 74 253 32.48  5/10     PHYSICAL EXAM General Level of Distress: no acute distress Overall Appearance: normal  Head and Face  Right Left  Fundoscopic Exam:  normal normal    Cardiovascular Cardiac: regular rate and rhythm without murmur  Right Left  Carotid Pulses: normal normal  Respiratory Lungs: clear to auscultation  Neurological Orientation: normal Recent and Remote Memory: normal Attention Span and Concentration:   normal Language: normal Fund of Knowledge: normal  Right Left Sensation: normal normal Upper Extremity Coordination: normal normal  Lower Extremity Coordination: normal normal  Musculoskeletal Gait and Station: normal  Right Left Upper Extremity Muscle Strength: normal normal Lower Extremity Muscle Strength: normal normal Upper Extremity Muscle Tone:  normal normal Lower Extremity Muscle Tone: normal normal  Motor Strength Upper and lower extremity motor strength was tested in the clinically pertinent muscles.     Deep Tendon Reflexes  Right Left Biceps: normal normal Triceps: normal normal Brachiloradialis: normal normal Patellar: normal absent Achilles: normal normal  Sensory Sensation was tested at L1 to S1. Any abnormal findings will be noted below.  Right Left L3:  decreased   Cranial Nerves II. Optic Nerve/Visual  Fields: normal III. Oculomotor: normal IV. Trochlear: normal V. Trigeminal: normal VI. Abducens: normal VII. Facial: normal VIII. Acoustic/Vestibular: normal IX. Glossopharyngeal: normal X. Vagus: normal XI. Spinal Accessory: normal XII. Hypoglossal: normal  Motor and other Tests Lhermittes: negative Rhomberg: negative Pronator drift: absent     Right Left Hoffman's: normal normal Clonus: normal normal Babinski: normal normal SLR: negative positive at 10 degrees Patrick's Pearlean Brownie): negative negative Toe Walk: normal normal Toe Lift: normal normal Heel Walk: normal normal SI Joint: nontender nontender   Additional Findings:  Patient is not able to bear weight.  He is not able to stand on his left leg and his quadriceps buckles.  He has an absent knee reflex on the left.  He has severe and unrelenting back and left leg pain.  He has numbness in an L3 distribution on the left.    DIAGNOSTIC RESULTS MRI was reviewed which shows what appears to be prior right L4 L5 discectomy with bony defect at this level.  He has spondylosis and stenosis at L3 L4 but has a fragment of herniated disc material  underlying the L3 nerve root at the L34 level on the left which I believe is the basis for his severe left leg pain.    IMPRESSION Severe left L3 radiculopathy with weakness and a large herniated disc with uncontrollable pain  Completed Orders (this encounter) Order Details Reason Side Interpretation Result Initial Treatment Date Region  Lifestyle education regarding diet Encouraged to eat a well balanced diet and follow up with primary care physician.        Hypertension education Follow up with primary care physician.         Assessment/Plan # Detail Type Description   1. Assessment BMI 32.0-32.9,ADULT (V85.32).   Plan Orders Today's instructions / counseling include(s) Lifestyle education regarding diet.       2. Assessment Hypertension, Unspecified (401.9).       3.  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       4. Assessment Lumbar spondylosis (721.3).       5. Assessment Lumbar radiculopathy (724.4).       6. Assessment Lumbago (724.2).         Pain Assessment/Treatment Pain Scale: 5/10. Method: Numeric Pain Intensity Scale. Location: back. Onset: 03/13/2014. Duration: varies. Quality: aching, shooting, stabbing. Pain Assessment/Treatment follow-up plan of care: Patient currently taking pain medication..  I recommended left L3 L4 microdiscectomy for removal of free fragment of herniated disc material to be performed on 03/21/14.  Risks and benefits of surgery discussed in detail with the patient wishes to proceed.  Orders: Diagnostic Procedures: Assessment Procedure  722.10 Microdiscectomy - left - L3-L4  Instruction(s)/Education: Assessment Instruction  401.9 Hypertension education  V85.32 Lifestyle education regarding diet             Provider:  Danae OrleansJoseph D. Venetia MaxonStern MD  03/20/2014 03:17 PM Dictation edited by: Danae OrleansJoseph D. Venetia MaxonStern    CC Providers: Maeola HarmanJoseph Jermine Bibbee MD [address] [place], [place] [id number]  ----------------------------------------------------------------------------------------------------------------------------------------------------------------------         Electronically signed by Danae OrleansJoseph D. Venetia MaxonStern MD on 03/20/2014 04:11 PM

## 2014-03-21 NOTE — Anesthesia Preprocedure Evaluation (Signed)
Anesthesia Evaluation  Patient identified by MRN, date of birth, ID band Patient awake    Reviewed: Allergy & Precautions, H&P , NPO status , Patient's Chart, lab work & pertinent test results  History of Anesthesia Complications Negative for: history of anesthetic complications  Airway Mallampati: II TM Distance: >3 FB Neck ROM: Full    Dental  (+) Teeth Intact   Pulmonary neg pulmonary ROS, neg sleep apnea, neg COPD breath sounds clear to auscultation        Cardiovascular hypertension, Pt. on medications - angina- Past MI, - CHF and - DOE - dysrhythmias Rhythm:Regular Rate:Normal     Neuro/Psych Low back pain radiating down left leg negative psych ROS   GI/Hepatic negative GI ROS, Neg liver ROS,   Endo/Other  diabetes, Type 2, Oral Hypoglycemic Agents  Renal/GU negative Renal ROS     Musculoskeletal   Abdominal   Peds  Hematology negative hematology ROS (+)   Anesthesia Other Findings   Reproductive/Obstetrics                           Anesthesia Physical Anesthesia Plan  ASA: II  Anesthesia Plan: General   Post-op Pain Management:    Induction: Intravenous  Airway Management Planned: Oral ETT  Additional Equipment: None  Intra-op Plan:   Post-operative Plan: Extubation in OR  Informed Consent: I have reviewed the patients History and Physical, chart, labs and discussed the procedure including the risks, benefits and alternatives for the proposed anesthesia with the patient or authorized representative who has indicated his/her understanding and acceptance.   Dental advisory given  Plan Discussed with: CRNA and Surgeon  Anesthesia Plan Comments:         Anesthesia Quick Evaluation

## 2014-03-21 NOTE — Preoperative (Signed)
Beta Blockers   Reason not to administer Beta Blockers:Not Applicable 

## 2014-03-21 NOTE — Anesthesia Postprocedure Evaluation (Signed)
  Anesthesia Post-op Note  Patient: Maurice Barker  Procedure(s) Performed: Procedure(s) with comments: Left L3-4 Microdiskectomy (Left) - Left Lumbar Three-Four Microdiskectomy  Patient Location: PACU  Anesthesia Type:General  Level of Consciousness: awake, alert , oriented and patient cooperative  Airway and Oxygen Therapy: Patient Spontanous Breathing  Post-op Pain: mild  Post-op Assessment: Post-op Vital signs reviewed, Patient's Cardiovascular Status Stable, Respiratory Function Stable, Patent Airway, No signs of Nausea or vomiting and Pain level controlled  Post-op Vital Signs: Reviewed and stable  Complications: No apparent anesthesia complications

## 2014-03-21 NOTE — Interval H&P Note (Signed)
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 After consideration of risks, benefits and other options for treatment, the patient has consented to  Procedure(s) with comments: Left L3-4 Microdiskectomy (Left) - Left L3-4 Microdiskectomy as a surgical intervention .  The patient's history has been reviewed, patient examined, no change in status, stable for surgery.  I have reviewed the patient's chart and labs.  Questions were answered to the patient's satisfaction.     Shirley Decamp D

## 2014-03-21 NOTE — Op Note (Signed)
03/21/2014  7:12 PM  PATIENT:  Maurice Barker  48 y.o. 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, degenerative disc disease, radiculopathy   PROCEDURE:  Procedure(s) with comments: Left L3-4 Microdiskectomy (Left) - Left Lumbar Three-Four Microdiskectomy with microdissection  SURGEON:  Surgeon(s) and Role:    * Maeola HarmanJoseph Julieanna Geraci, MD - Primary    * Hewitt Shortsobert W Nudelman, MD - Assisting  PHYSICIAN ASSISTANT:   ASSISTANTS: Poteat, RN   ANESTHESIA:   general  EBL:     BLOOD ADMINISTERED:none  DRAINS: none   LOCAL MEDICATIONS USED:  LIDOCAINE   SPECIMEN:  No Specimen  DISPOSITION OF SPECIMEN:  N/A  COUNTS:  YES  TOURNIQUET:  * No tourniquets in log *  DICTATION: DICTATION: Patient has a large L34 disc rupture on the left with significant left leg pain and weakness. It was elected to take him to surgery for left L 34 microdiscectomy.  Procedure: Patient was brought to the operating room and following the smooth and uncomplicated induction of general endotracheal anesthesia he was placed in a prone position on the Wilson frame. Low back was prepped and draped in the usual sterile fashion with betadine scrub and DuraPrep. Area of planned incision was infiltrated with local lidocaine. A preop localizing X ray was obtained with an 18 gauge spinal needle at the L 34 interspace.  Incision was made in the midline and carried to the lumbodorsal fascia which was incised on the left side of midline. Subperiosteal dissection was performed exposing what was felt to be L 34 level. Intraoperative x-ray demonstrated marker probe at L3-4 level .  A hemi-semi-laminectomy of L3 was performed a high-speed drill and completed with Kerrison rongeurs and a generous foraminotomy was performed overlying the superior aspect of the L4 lamina.  Ligamentum flavum was detached and removed in a piecemeal fashion and the common dural tube and L 4  nerve root was decompressed laterally with removal of the superior aspect of the facet and ligamentum causing nerve root compression. The microscope was brought into the field and the thecal sac and  nerve root was mobilized medially. This exposed a large amount of soft disc material and a free fragment of herniated disc material. Multiple fragments were removed and these extended into the interspace which appeared to be quite soft with a disrupted annulus overlying the interspace. As a result it was elected to further decompress the interspace and remove loose disc material and this was done with a variety Epstein curettes and pituitary rongeurs. The redundant annulus was also removed with 2 mm Kerrison rongeur. There was evidence on the MRI of cephalad fragments and these were carefully removed and the L3 nerve root was also decompressed. At this point it was felt that all neural elements were well decompressed and there was no evidence of residual loose disc material within the interspace. The interspace was then irrigated with saline and no additional disc material was mobilized. Hemostasis was assured with bipolar electrocautery and the interspace was irrigated with Depo-Medrol and fentanyl. The lumbodorsal fascia was closed with 0 Vicryl sutures the subcutaneous tissues reapproximated 2-0 Vicryl inverted sutures and the skin edges were reapproximated with 3-0 Vicryl subcuticular stitch. The wound is dressed with Dermabond. Patient was extubated in the operating room and taken to recovery in stable and satisfactory condition having tolerated his operation well counts were correct at  the end of the case.  PLAN OF CARE: Admit for overnight observation  PATIENT DISPOSITION:  PACU - hemodynamically stable.   Delay start of Pharmacological VTE agent (>24hrs) due to surgical blood loss or risk of bleeding:  yes

## 2014-03-21 NOTE — Progress Notes (Signed)
Awake, alert, resting comfortably.  Still sleepy.  Good strength in both legs.  Doing well.

## 2014-03-21 NOTE — Anesthesia Procedure Notes (Signed)
Procedure Name: Intubation Date/Time: 03/21/2014 5:25 PM Performed by: Sarita HaverFLOWERS, Jayde Mcallister T Pre-anesthesia Checklist: Patient identified, Timeout performed, Emergency Drugs available, Suction available and Patient being monitored Patient Re-evaluated:Patient Re-evaluated prior to inductionOxygen Delivery Method: Circle system utilized and Simple face mask Preoxygenation: Pre-oxygenation with 100% oxygen Intubation Type: IV induction Ventilation: Mask ventilation without difficulty Laryngoscope Size: Miller and 3 Grade View: Grade II Tube type: Oral Tube size: 7.5 mm Number of attempts: 1 Airway Equipment and Method: Patient positioned with wedge pillow and Stylet Placement Confirmation: ETT inserted through vocal cords under direct vision,  positive ETCO2 and breath sounds checked- equal and bilateral Secured at: 24 cm Tube secured with: Tape Dental Injury: Teeth and Oropharynx as per pre-operative assessment

## 2014-03-21 NOTE — Transfer of Care (Signed)
Immediate Anesthesia Transfer of Care Note  Patient: Maurice Barker  Procedure(s) Performed: Procedure(s) with comments: Left L3-4 Microdiskectomy (Left) - Left Lumbar Three-Four Microdiskectomy  Patient Location: PACU  Anesthesia Type:General  Level of Consciousness: responds to stimulation  Airway & Oxygen Therapy: Patient connected to nasal cannula oxygen  Post-op Assessment: Report given to PACU RN, Post -op Vital signs reviewed and stable and Patient moving all extremities  Post vital signs: Reviewed and stable  Complications: No apparent anesthesia complications

## 2014-03-21 NOTE — Brief Op Note (Signed)
03/21/2014  7:12 PM  PATIENT:  Maurice Barker  48 y.o. 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, degenerative disc disease, radiculopathy   PROCEDURE:  Procedure(s) with comments: Left L3-4 Microdiskectomy (Left) - Left Lumbar Three-Four Microdiskectomy with microdissection  SURGEON:  Surgeon(s) and Role:    * Maeola HarmanJoseph Vincentina Sollers, MD - Primary    * Hewitt Shortsobert W Nudelman, MD - Assisting  PHYSICIAN ASSISTANT:   ASSISTANTS: Poteat, RN   ANESTHESIA:   general  EBL:     BLOOD ADMINISTERED:none  DRAINS: none   LOCAL MEDICATIONS USED:  LIDOCAINE   SPECIMEN:  No Specimen  DISPOSITION OF SPECIMEN:  N/A  COUNTS:  YES  TOURNIQUET:  * No tourniquets in log *  DICTATION: DICTATION: Patient has a large L34 disc rupture on the left with significant left leg pain and weakness. It was elected to take him to surgery for left L 34 microdiscectomy.  Procedure: Patient was brought to the operating room and following the smooth and uncomplicated induction of general endotracheal anesthesia he was placed in a prone position on the Wilson frame. Low back was prepped and draped in the usual sterile fashion with betadine scrub and DuraPrep. Area of planned incision was infiltrated with local lidocaine. A preop localizing X ray was obtained with an 18 gauge spinal needle at the L 34 interspace.  Incision was made in the midline and carried to the lumbodorsal fascia which was incised on the left side of midline. Subperiosteal dissection was performed exposing what was felt to be L 34 level. Intraoperative x-ray demonstrated marker probe at L3-4 level .  A hemi-semi-laminectomy of L3 was performed a high-speed drill and completed with Kerrison rongeurs and a generous foraminotomy was performed overlying the superior aspect of the L4 lamina.  Ligamentum flavum was detached and removed in a piecemeal fashion and the common dural tube and L 4  nerve root was decompressed laterally with removal of the superior aspect of the facet and ligamentum causing nerve root compression. The microscope was brought into the field and the thecal sac and  nerve root was mobilized medially. This exposed a large amount of soft disc material and a free fragment of herniated disc material. Multiple fragments were removed and these extended into the interspace which appeared to be quite soft with a disrupted annulus overlying the interspace. As a result it was elected to further decompress the interspace and remove loose disc material and this was done with a variety Epstein curettes and pituitary rongeurs. The redundant annulus was also removed with 2 mm Kerrison rongeur. There was evidence on the MRI of cephalad fragments and these were carefully removed and the L3 nerve root was also decompressed. At this point it was felt that all neural elements were well decompressed and there was no evidence of residual loose disc material within the interspace. The interspace was then irrigated with saline and no additional disc material was mobilized. Hemostasis was assured with bipolar electrocautery and the interspace was irrigated with Depo-Medrol and fentanyl. The lumbodorsal fascia was closed with 0 Vicryl sutures the subcutaneous tissues reapproximated 2-0 Vicryl inverted sutures and the skin edges were reapproximated with 3-0 Vicryl subcuticular stitch. The wound is dressed with Dermabond. Patient was extubated in the operating room and taken to recovery in stable and satisfactory condition having tolerated his operation well counts were correct at  the end of the case.  PLAN OF CARE: Admit for overnight observation  PATIENT DISPOSITION:  PACU - hemodynamically stable.   Delay start of Pharmacological VTE agent (>24hrs) due to surgical blood loss or risk of bleeding:  yes

## 2014-03-22 ENCOUNTER — Encounter (HOSPITAL_COMMUNITY): Payer: Self-pay | Admitting: Neurosurgery

## 2014-03-22 LAB — GLUCOSE, CAPILLARY: Glucose-Capillary: 179 mg/dL — ABNORMAL HIGH (ref 70–99)

## 2014-03-22 LAB — HEMOGLOBIN A1C
Hgb A1c MFr Bld: 10.4 % — ABNORMAL HIGH (ref <5.7)
MEAN PLASMA GLUCOSE: 252 mg/dL — AB (ref <117)

## 2014-03-22 NOTE — Clinical Social Work Note (Signed)
CSW received consult for possible SNF placement once pt is medically stable for discharge from [facility]. PT/OT to evaluate pt on 03/22/2014. CSW will continue to follow and assist with discharge planning if SNF placement recommended. ° °Emily [place], LCSWA °Clinical Social Worker °[phone] °

## 2014-03-22 NOTE — Discharge Summary (Signed)
Physician Discharge Summary  Patient ID: Maurice DakinsMario Barker MRN: [id number] DOB/AGE: 48/07/1966 48 y.o.  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  Following uncomplicated microdiscectomy, he recovered nicely and transferred to [facility] for observation. He has progressed well.  Consults: None  Significant Diagnostic Studies: radiology: X-Ray: intra-operative  Treatments: surgery: Left L3-4 Microdiskectomy (Left) - Left Lumbar Three-Four Microdiskectomy with microdissection   Discharge Exam: Blood pressure 137/81, pulse 88, temperature 97.3 F (36.3 C), temperature source Oral, resp. rate 20, height 6\' 2"  (1.88 m), weight 115.894 kg (255 lb 8 oz), SpO2 98.00%. Alert, smiling, conversant. Wife present. Good strength BLE. Pt now able to lay supine without report of pain. Honeycomb drsg intact over Dermabond. No erythema,swelling, or drainage.    Disposition: 01-Home or Self Care  Pt verbalizes understanding of d/c instructions and already has f/u appt date. Rx's to pt for Hydrocodone & Valium.      Medication List         aspirin 81 MG chewable tablet  Chew 81 mg by mouth daily.     atorvastatin 10 MG tablet  Commonly known as:  LIPITOR  Take 10 mg by mouth daily.     cyclobenzaprine 10 MG tablet  Commonly known as:  FLEXERIL  Take 10 mg by mouth once.     diazepam 5 MG tablet  Commonly known as:  VALIUM  Take 5 mg by mouth 2 (two) times daily.     HYDROcodone-acetaminophen 10-325 MG per tablet  Commonly known as:  NORCO  Take 1 tablet by mouth every 6 (six) hours as needed for moderate pain or severe pain.     losartan 50 MG tablet  Commonly known as:  COZAAR  Take 50 mg by mouth daily.     metFORMIN 500 MG tablet  Commonly known as:  GLUCOPHAGE  Take by mouth 2 (two) times daily with a meal.     oxyCODONE-acetaminophen 5-325 MG per tablet  Commonly known as:  PERCOCET/ROXICET  Take 1-2 tablets by mouth every 6 (six) hours as needed for severe pain.     predniSONE 10 MG tablet  Commonly known as:  DELTASONE  Take 10-60 mg by mouth daily with breakfast. 60mg  day 1, 50mg  day 2, 40mg  day 3, 30mg  day 4, 20mg  day 5, 10mg  day 6     saxagliptin HCl 2.5 MG Tabs tablet  Commonly known as:  ONGLYZA  Take 5 mg by mouth daily.         Signed: Georgiann Cockeroteat, Brian 03/22/2014, 8:33 AM

## 2014-03-22 NOTE — Progress Notes (Signed)
OT Cancellation and Discharge Note  Patient Details Name: Maurice Barker MRN: [id number] DOB: 09/11/1966   Cancelled Treatment:    Reason Eval/Treat Not Completed: OT screened, no needs identified, will sign off. Pt with history of back surgery. Educated pt on 3/3 back precautions. Wife present and states she will be home 24/7 to assist with ADLs as needed. Pt observed to ambulate to bathroom with supervision. Pt has no OT concerns. Acute OT to sign off at this time.   Rae LipsMiller, Daryn Pisani M [phone] 03/22/2014, 9:45 AM

## 2014-03-22 NOTE — Evaluation (Signed)
Physical Therapy Evaluation Patient Details Name: Maurice Barker MRN: [id number] DOB: 1966-03-04 Today's Date: 03/22/2014   History of Present Illness  Patient is a 48 yo male s/p L4-5 PLIF.  Clinical Impression  Patient is functioning at supervision level for mobility/ambulation.  Provided patient and wife with education on back precautions and their impact on mobility, stairs, car transfer, etc.  No further acute PT needs identified - PT will sign off.    Follow Up Recommendations No PT follow up;Supervision/Assistance - 24 hour    Equipment Recommendations  None recommended by PT    Recommendations for Other Services       Precautions / Restrictions Precautions Precautions: Back Precaution Booklet Issued: Yes (comment) Precaution Comments: Reviewed back precautions and other information with patient and wife. Restrictions Weight Bearing Restrictions: No      Mobility  Bed Mobility Overal bed mobility: Independent             General bed mobility comments: Patient instructed in and demonstrated rolling > sidelying > sitting.  No physical assist needed.  Transfers Overall transfer level: Independent Equipment used: None             General transfer comment: Good technique and balance with transfers.  Ambulation/Gait Ambulation/Gait assistance: Supervision Ambulation Distance (Feet): 84 Feet Assistive device: None Gait Pattern/deviations: Step-through pattern;Decreased stride length Gait velocity: slow Gait velocity interpretation: Below normal speed for age/gender General Gait Details: Patient with slow guarded gait pattern.  Balance is good on level surfaces and with head turns.  Stairs            Wheelchair Mobility    Modified Rankin (Stroke Patients Only)       Balance Overall balance assessment: No apparent balance deficits (not formally assessed)                                   Pertinent Vitals/Pain     Home  Living Family/patient expects to be discharged to:: Private residence Living Arrangements: Spouse/significant other Available Help at Discharge: Family;Available 24 hours/day Type of Home: House Home Access: Stairs to enter Entrance Stairs-Rails: None Entrance Stairs-Number of Steps: 1 Home Layout: Two level;Laundry or work area in basement;Able to live on main level with [employer]: None      Prior Function Level of Independence: Independent               Higher education careers adviser        Extremity/Trunk Assessment   Upper Extremity Assessment: Overall WFL for tasks assessed           Lower Extremity Assessment: Generalized weakness         Communication   Communication: No difficulties  Cognition Arousal/Alertness: Awake/alert Behavior During Therapy: WFL for tasks assessed/performed Overall Cognitive Status: Within Functional Limits for tasks assessed                      General Comments   Exercises      Assessment/Plan    PT Assessment Patent does not need any further PT services  PT Diagnosis     PT Problem List    PT Treatment Interventions     PT Goals (Current goals can be found in the Care Plan section)      Frequency     Barriers to discharge        End of Session   Activity Tolerance: Patient  tolerated treatment well Patient left: in chair;with call bell/phone within reach;with family/visitor present    Functional Assessment Tool Used: Clinical judgement Functional Limitation: Mobility: Walking and moving around Mobility: Walking and Moving Around Current Status ([id number](G8978): At least 1 percent but less than 20 percent impaired, limited or restricted Mobility: Walking and Moving Around Goal Status [phone](G8979): At least 1 percent but less than 20 percent impaired, limited or restricted Mobility: Walking and Moving Around Discharge Status [phone](G8980): At least 1 percent but less than 20 percent impaired, limited or restricted     Time: 1031-1045 PT Time Calculation (min): 14 min   Charges:   PT Evaluation $Initial PT Evaluation Tier I: 1 Procedure     PT G Codes:   Functional Assessment Tool Used: Clinical judgement Functional Limitation: Mobility: Walking and moving around    Vena AustriaDavis, Dali Kraner H 03/22/2014, 10:58 AM Durenda HurtSusan H. Renaldo Fiddleravis, PT, [facility] Acute Rehab Services Pager [phone]

## 2014-03-22 NOTE — Progress Notes (Signed)
Subjective: Patient reports "The pain is 100% better"  Objective: Vital signs in last 24 hours: Temp:  [97.3 F (36.3 C)-98.6 F (37 C)] 97.3 F (36.3 C) (03/27 0617) Pulse Rate:  [77-108] 88 (03/27 0617) Resp:  [14-24] 20 (03/27 0617) BP: (129-186)/(72-117) 137/81 mmHg (03/27 0617) SpO2:  [94 %-100 %] 98 % (03/27 0617) Weight:  [115.894 kg (255 lb 8 oz)-121.11 kg (267 lb)] 115.894 kg (255 lb 8 oz) (03/26 2059)  Intake/Output from previous day: 03/26 0701 - 03/27 0700 In: 2855 [P.O.:540; I.V.:2265; IV Piggyback:50] Out: 50 [Blood:50] Intake/Output this shift:    Alert, smiling, conversant. Wife present. Good strength BLE. Pt now able to lay supine without report of pain. Honeycomb drsg intact over Dermabond. No erythema,swelling, or drainage.   Lab Results:  Recent Labs  03/21/14 1430  WBC 11.2*  HGB 16.6  HCT 47.7  PLT 309   BMET  Recent Labs  03/21/14 1430  NA 137  K 4.7  CL 94*  CO2 28  GLUCOSE 216*  BUN 19  CREATININE 0.86  CALCIUM 9.5    Studies/Results: Dg Chest 2 View  03/21/2014   CLINICAL DATA:  History of back trauma. Back surgery tonight. No chest complaints.  EXAM: CHEST  2 VIEW  COMPARISON:  None.  FINDINGS: The heart size and mediastinal contours are within normal limits. Both lungs are clear. The visualized skeletal structures are unremarkable.  IMPRESSION: No active cardiopulmonary disease.   Electronically Signed   By: Rosalie GumsBeth  Brown M.D.   On: 03/21/2014 16:52   Dg Lumbar Spine 2-3 Views  03/21/2014   CLINICAL DATA:  Lumbar disc herniation.  EXAM: LUMBAR SPINE - 2-3 VIEW  COMPARISON:  None.  FINDINGS: First intraoperative cross-table lateral view shows a needle posteriorly directed at the interspinous space at L3-4.  Second intraoperative radiograph shows a probe overlying the posterior elements at the level of L3-4.  IMPRESSION: Intraoperative localization of L3-4.   Electronically Signed   By: Myles RosenthalJohn  Stahl M.D.   On: 03/21/2014 18:26     Assessment/Plan: Improved    LOS: 1 day  Per Dr. Venetia MaxonStern, d/c IV, d/c to home. Pt verbalizes understanding of d/c instructions and already has f/u appt date. Rx's to pt for Hydrocodone & Valium.   Georgiann Cockeroteat, Emmitte Surgeon 03/22/2014, 8:29 AM

## 2014-03-25 NOTE — Progress Notes (Signed)
UR COMPLETED  

## 2014-09-06 IMAGING — CR DG LUMBAR SPINE COMPLETE 4+V
5 series · 5 of 5 positions shown · non-contrast
Comparison: None.

CLINICAL DATA: Low back pain with left-sided radicular symptoms

EXAM:
LUMBAR SPINE - COMPLETE 4+ VIEW

[t l-spine a.p.]
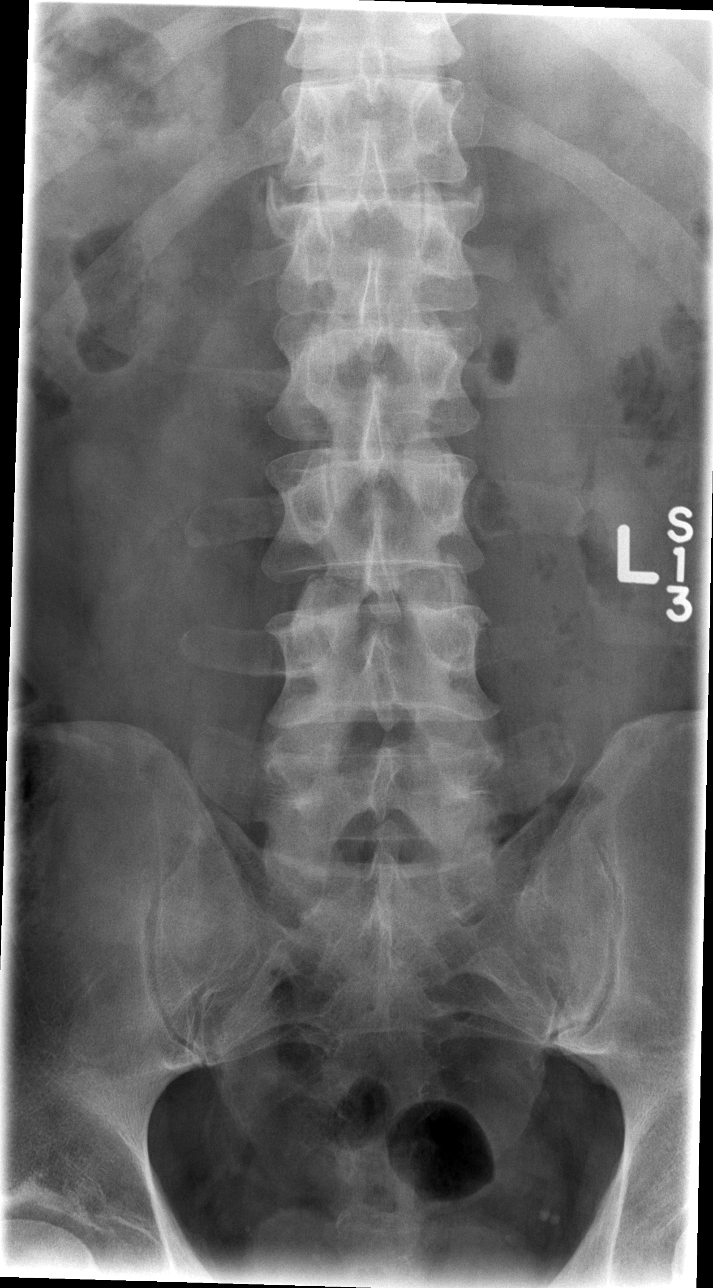

[t l-spine oblique exposure (1 of 2)]
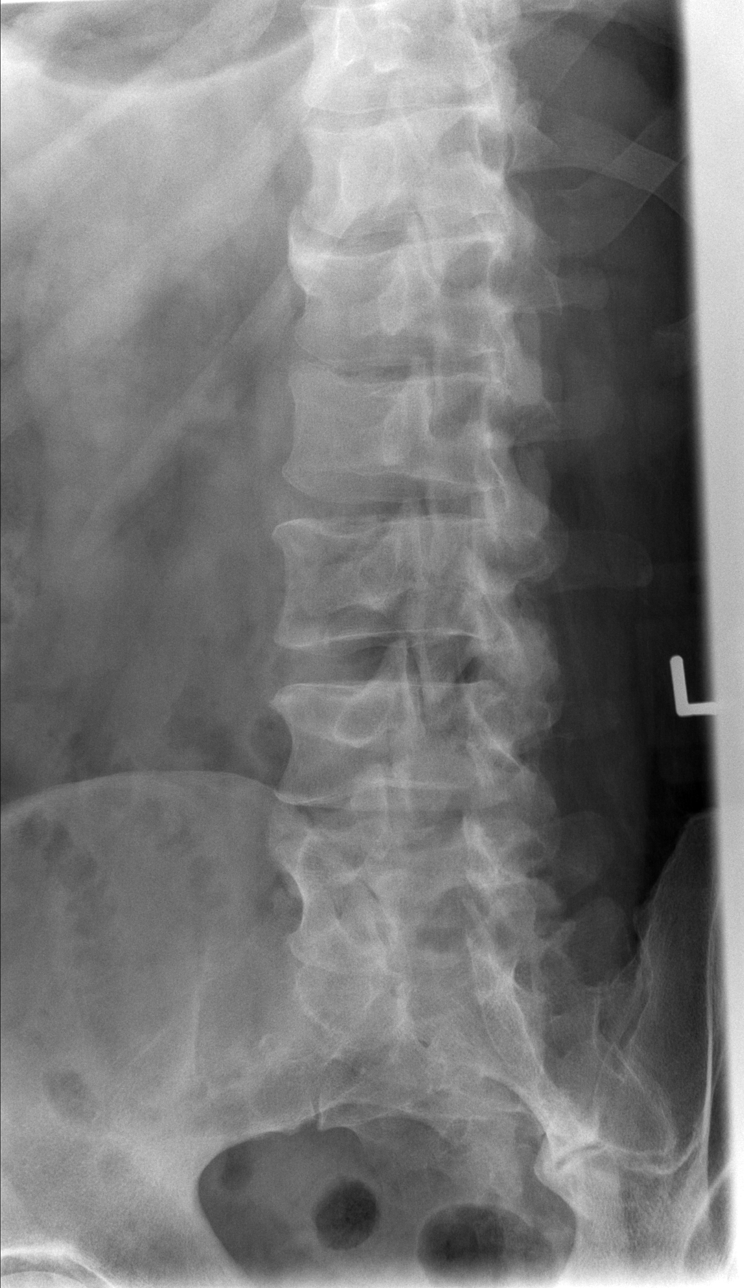

[t l-spine oblique exposure (2 of 2)]
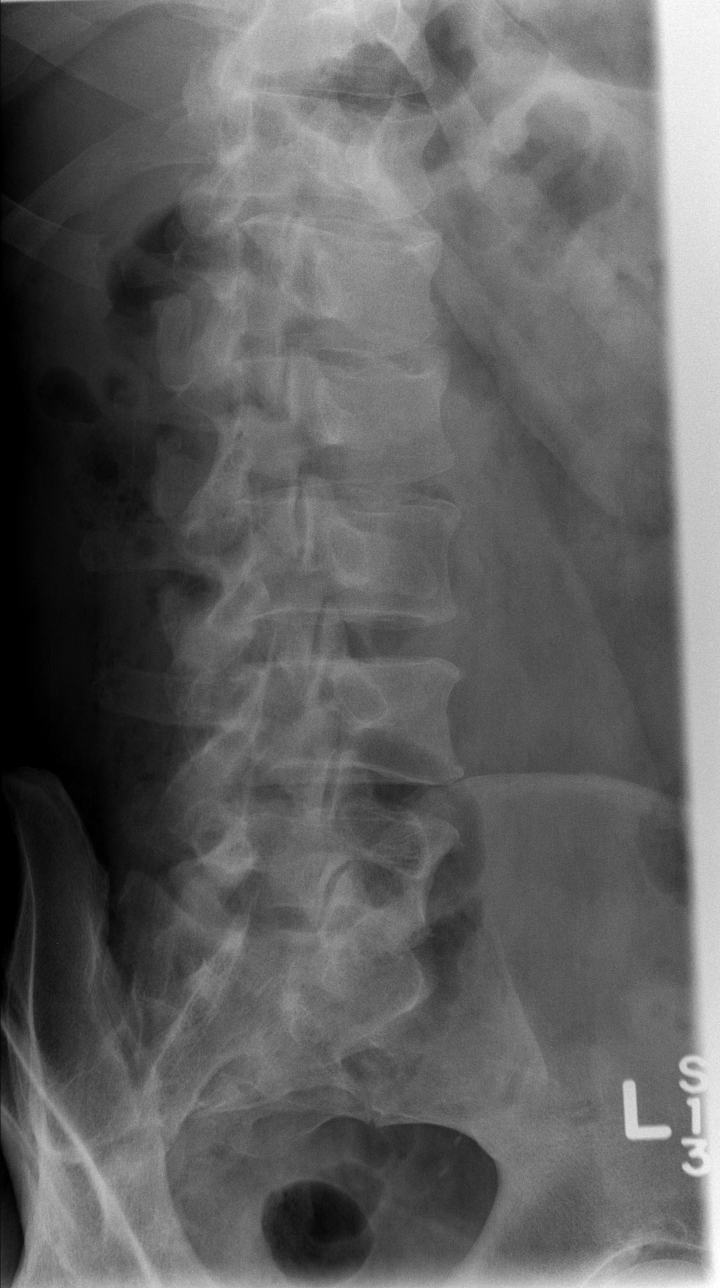

[t l-spine lat]
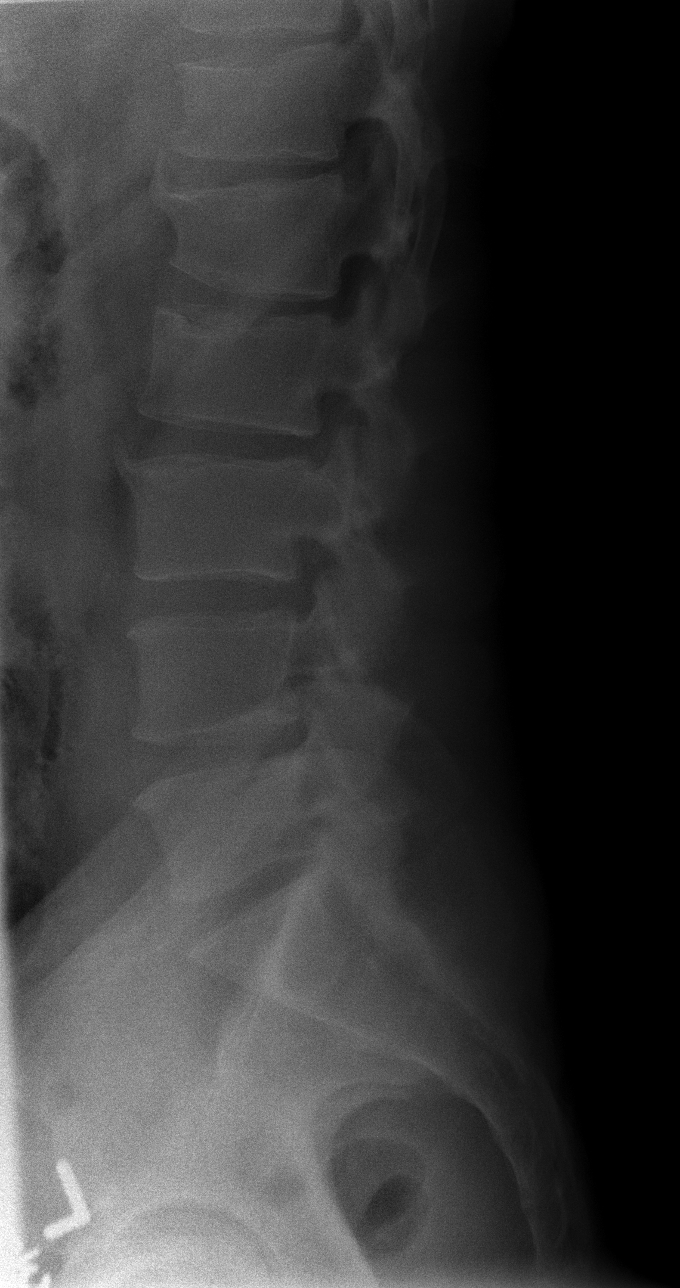

[t l-spine l5-s1 spot]
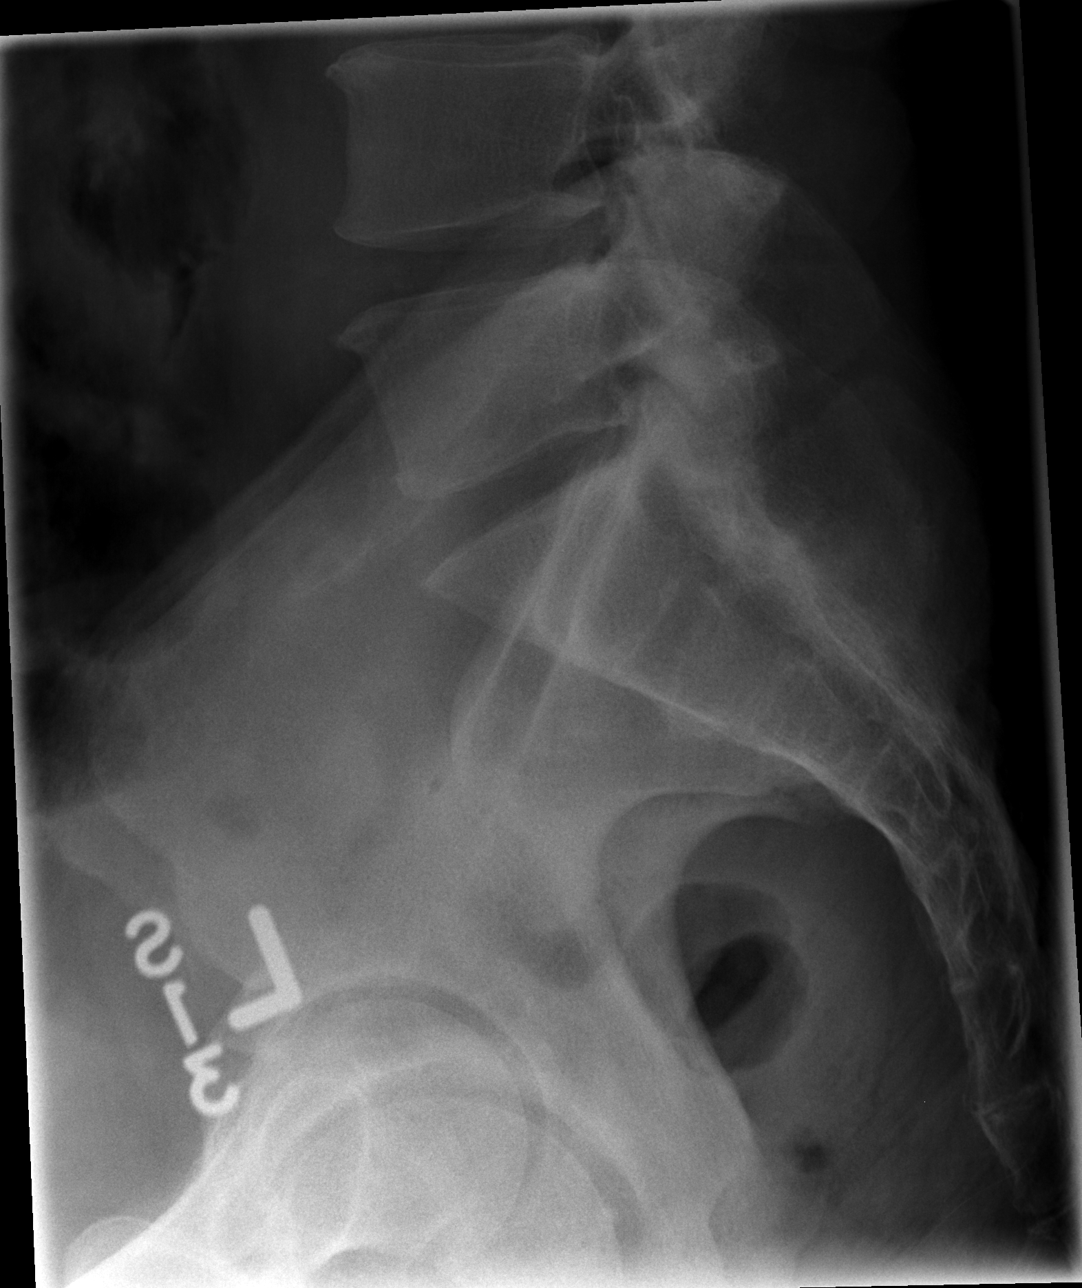

[5 of 5 positions shown; findings below may reference images not displayed]

FINDINGS: Frontal, lateral, spot lumbosacral lateral, and bilateral oblique
views were obtained. There are 5 non-rib-bearing lumbar type
vertebral bodies. There is anterior wedging of the T12 and L1
vertebral bodies which does not appear acute. No other fractures. No
spondylolisthesis. Is mild disc space narrowing at L1-2 and L4-5.
There is facet osteoarthritic change at L5-S1 bilaterally.
IMPRESSION: Probable chronic anterior wedge fractures at T12 and L1. Areas of
osteoarthritic change. No spondylolisthesis.
